# Patient Record
Sex: Male | Born: 1975 | Race: White | Hispanic: No | Marital: Married | Smoking: Former smoker
Health system: Southern US, Community
[De-identification: ages and names within clinical notes are randomized; demographics above are authoritative.]

## PROBLEM LIST (undated history)

## (undated) DIAGNOSIS — I10 Essential (primary) hypertension: Secondary | ICD-10-CM

## (undated) HISTORY — PX: TONSILLECTOMY: SUR1361

---

## 2014-01-24 ENCOUNTER — Emergency Department: Payer: Self-pay | Admitting: Emergency Medicine

## 2014-10-08 ENCOUNTER — Ambulatory Visit
Admission: EM | Admit: 2014-10-08 | Discharge: 2014-10-08 | Disposition: A | Discharge status: Home / Self Care | Payer: Self-pay | Attending: Internal Medicine | Admitting: Internal Medicine

## 2014-10-08 ENCOUNTER — Encounter: Payer: Self-pay | Admitting: Emergency Medicine

## 2014-10-08 DIAGNOSIS — R490 Dysphonia: Secondary | ICD-10-CM

## 2014-10-08 DIAGNOSIS — J392 Other diseases of pharynx: Secondary | ICD-10-CM

## 2014-10-08 DIAGNOSIS — R220 Localized swelling, mass and lump, head: Secondary | ICD-10-CM

## 2014-10-08 DIAGNOSIS — R070 Pain in throat: Secondary | ICD-10-CM

## 2014-10-08 DIAGNOSIS — R221 Localized swelling, mass and lump, neck: Secondary | ICD-10-CM

## 2014-10-08 MED ORDER — NAPROXEN 500 MG PO TABS: 500.0000 mg | ORAL_TABLET | Freq: Two times a day (BID) | ORAL | Status: DC

## 2014-10-08 NOTE — ED Provider Notes (Signed)
CSN: [id number]     Arrival date & time 10/08/14  1629 History   First MD Initiated Contact with Patient 10/08/14 1702     Chief Complaint  Patient presents with  . Sore Throat   (Consider location/radiation/quality/duration/timing/severity/associated sxs/prior Treatment) HPI  A 39 year old gentleman long history of smoking or late 1 pack per day since the age of 39 presents with discomfort in his throat particularly when he swallows he feels something rubbing in his throat on the right side. His wife is also noticed he has been very hoarse for the last week. The patient states that it is been worsening over the last 3-4 weeks although he's noticed this for 4 months. It is much worse in the morning; in order to be able to drink his coffee he must swallow ice water from an ice cube initially. He is unable to tell me if he has had weight loss. Is no shortness of breath or cough.  History reviewed. No pertinent past medical history. Past Surgical History  Procedure Laterality Date  . Tonsillectomy      adnoids removed also   History reviewed. No pertinent family history. History  Substance Use Topics  . Smoking status: Current Every Day Smoker -- 1.00 packs/day    Types: Cigarettes  . Smokeless tobacco: Not on file  . Alcohol Use: No    Review of Systems  HENT: Positive for sore throat.   All other systems reviewed and are negative.   Allergies  Penicillins  Home Medications   Prior to Admission medications   Medication Sig Start Date End Date Taking? Authorizing Provider  naproxen (NAPROSYN) 500 MG tablet Take 1 tablet (500 mg total) by mouth 2 (two) times daily with a meal. 10/08/14   Lutricia FeilWilliam P Madeline Bebout, PA-C   BP 125/76 mmHg  Pulse 78  Temp(Src) 98.1 F (36.7 C) (Oral)  Resp 16  SpO2 100% Physical Exam  Constitutional: He is oriented to person, place, and time. He appears well-developed and well-nourished.  HENT:  Head: Normocephalic and atraumatic.  Right Ear: External  ear normal.  Left Ear: External ear normal.  Mouth/Throat: Oropharynx is clear and moist.  Examination of pharynx shows no erythema. Exertion of the throat shows a hard mass adjacent to the level of the larynx and with pressure patient perceives radiation into his tongue and into his clavicle on the right. His voice is hoarse. He has no supraclavicular nodes palpable.  Neck:  See HEENT above  Pulmonary/Chest: Effort normal and breath sounds normal. No respiratory distress. He has no wheezes. He has no rales.  Neurological: He is alert and oriented to person, place, and time.  Skin: Skin is warm and dry.  Psychiatric: He has a normal mood and affect. His behavior is normal. Judgment and thought content normal.  Nursing note and vitals reviewed.   ED Course  Procedures (including critical care time) Labs Review Labs Reviewed - No data to display  Imaging Review No results found.   MDM   1. Hoarseness of voice   2. Throat pain in adult   3. Mass of throat    Discharge Medication List as of 10/08/2014  5:36 PM    START taking these medications   Details  naproxen (NAPROSYN) 500 MG tablet Take 1 tablet (500 mg total) by mouth 2 (two) times daily with a meal., Starting 10/08/2014, Until Discontinued, Print       Plan: 1. Diagnosis reviewed with patient 2. rx as per orders; risks, benefits,  potential side effects reviewed with patient 3. Recommend supportive treatment with  4. F/u prn if symptoms worsen or don't improve  I've had a discussion with the patient and his wife and told them I am concerned about the length of time that he has had the sore throat and now with his hoarseness and the right sided firm mass that I am palpating adjacent to the larynx. I told them that they will need a higher level of evaluation and care. Refer them to [facility] for further evaluation. In the meantime he asked for pain medication and I have placed him on some Naprosyn in the interim. I would  like to have him seen sooner than later and they will call tomorrow. I had difficulty with Epic and arranging the referral because [facility] does not populate. If any referral is requested I will certainly be able to do that over the phone. He will contact me if there is any difficulties.  Lutricia Feil, PA-C 10/08/14 1820

## 2014-10-08 NOTE — ED Notes (Signed)
Pt states that for the past 4 months he has had throat discomfort and it has gradually become worse

## 2014-10-08 NOTE — Discharge Instructions (Signed)
Hoarseness Hoarseness is produced from a variety of causes. It is important to find the cause so it can be treated. In the absence of a cold or upper respiratory illness, any hoarseness lasting more than 2 weeks should be looked at by a specialist. This is especially important if you have a history of smoking or alcohol use. It is also important to keep in mind that as you grow older, your voice will naturally get weaker, making it easier for you to become hoarse from straining your vocal cords.  CAUSES  Any illness that affects your vocal cords can result in a hoarse voice. Examples of conditions that can affect the vocal cords are listed as follows:   Allergies.  Colds.  Sinusitis.  Gastroesophageal reflux disease.  Croup.  Injury.  Nodules.  Exposure to smoke or toxic fumes or gases.  Congenital and genetic defects.  Paralysis of the vocal cords.  Infections.  Advanced age. DIAGNOSIS  In order to diagnose the cause of your hoarseness, your caregiver will examine your throat using an instrument that uses a tube with a small lighted camera (laryngoscope). It allows your caregiver to look into the mouth and down the throat. TREATMENT  For most cases, treatment will focus on the specific cause of the hoarseness. Depending on the cause, hoarseness can be a temporary condition (acute) or it can be long lasting (chronic). Most cases of hoarseness clear up without complications. Your caregiver will explain to you if this is not likely to happen. SEEK IMMEDIATE MEDICAL CARE IF:   You have increasing hoarseness or loss of voice.  You have shortness of breath.  You are coughing up blood.  There is pain in your neck or throat. Document Released: 03/04/2005 Document Revised: 06/13/2011 Document Reviewed: 05/27/2010 ExitCare Patient Information 2015 [employer]. This information is not intended to replace advice given to you by your health care provider. Make sure you discuss any  questions you have with your health care provider.  

## 2014-11-06 ENCOUNTER — Ambulatory Visit: Payer: Medicaid Other | Attending: Unknown Physician Specialty | Admitting: Speech Pathology

## 2014-11-13 ENCOUNTER — Ambulatory Visit: Payer: Medicaid Other | Admitting: Speech Pathology

## 2018-11-28 ENCOUNTER — Other Ambulatory Visit: Payer: Self-pay

## 2018-11-28 ENCOUNTER — Ambulatory Visit
Admission: EM | Admit: 2018-11-28 | Discharge: 2018-11-28 | Disposition: A | Discharge status: Home / Self Care | Payer: BC Managed Care – PPO | Attending: Family Medicine | Admitting: Family Medicine

## 2018-11-28 ENCOUNTER — Encounter: Payer: Self-pay | Admitting: Emergency Medicine

## 2018-11-28 DIAGNOSIS — B349 Viral infection, unspecified: Secondary | ICD-10-CM | POA: Diagnosis not present

## 2018-11-28 DIAGNOSIS — R0602 Shortness of breath: Secondary | ICD-10-CM

## 2018-11-28 DIAGNOSIS — J029 Acute pharyngitis, unspecified: Secondary | ICD-10-CM | POA: Diagnosis not present

## 2018-11-28 DIAGNOSIS — R05 Cough: Secondary | ICD-10-CM | POA: Diagnosis not present

## 2018-11-28 HISTORY — DX: Essential (primary) hypertension: I10

## 2018-11-28 LAB — RAPID STREP SCREEN (MED CTR MEBANE ONLY): Streptococcus, Group A Screen (Direct): NEGATIVE

## 2018-11-28 NOTE — ED Triage Notes (Signed)
Pt c/o cough, shortness of breath, sore throat, sinus congestion, body aches ans chills. Started last night.

## 2018-11-28 NOTE — ED Provider Notes (Addendum)
[facility]    CSN: [id number] Arrival date & time: 11/28/18  1101      History   Chief Complaint Chief Complaint  Patient presents with  . Cough  . Shortness of Breath    HPI David Berg is a 43 y.o. male.   43 yo male with a c/o cough, shortness of breath, sore throat, nasal congestion, chills and bodyaches since last night. Denies any chest pain or wheezing. States son was sick recently with similar symptoms and tested negative for covid.    Cough Associated symptoms: shortness of breath   Shortness of Breath Associated symptoms: cough     Past Medical History:  Diagnosis Date  . Hypertension     There are no active problems to display for this patient.   Past Surgical History:  Procedure Laterality Date  . TONSILLECTOMY     adnoids removed also       Home Medications    Prior to Admission medications   Medication Sig Start Date End Date Taking? Authorizing Provider  cyclobenzaprine (FLEXERIL) 10 MG tablet  08/29/18  Yes [provider]  gabapentin (NEURONTIN) 300 MG capsule  10/30/18  Yes [provider]  naproxen (NAPROSYN) 500 MG tablet Take 1 tablet (500 mg total) by mouth 2 (two) times daily with a meal. 10/08/14   Lutricia Feiloemer, William P, PA-C    Family History Family History  Problem Relation Age of Onset  . Cancer Father        throat    Social History Social History   Tobacco Use  . Smoking status: Former Smoker    Packs/day: 1.00    Types: Cigarettes  . Smokeless tobacco: Never Used  Substance Use Topics  . Alcohol use: No  . Drug use: No     Allergies   Penicillins   Review of Systems Review of Systems  Respiratory: Positive for cough and shortness of breath.      Physical Exam Triage Vital Signs ED Triage Vitals  Enc Vitals Group     BP 11/28/18 1136 110/90     Pulse Rate 11/28/18 1136 91     Resp 11/28/18 1136 18     Temp 11/28/18 1136 98.8 F (37.1 C)     Temp Source 11/28/18  1136 Oral     SpO2 11/28/18 1136 99 %     Weight 11/28/18 1128 177 lb (80.3 kg)     Height 11/28/18 1128 5\' 6"  (1.676 m)     Head Circumference --      Peak Flow --      Pain Score 11/28/18 1127 8     Pain Loc --      Pain Edu? --      Excl. in GC? --    No data found.  Updated Vital Signs BP 110/90 (BP Location: Left Arm)   Pulse 91   Temp 98.8 F (37.1 C) (Oral)   Resp 18   Ht 5\' 6"  (1.676 m)   Wt 80.3 kg   SpO2 99%   BMI 28.57 kg/m   Visual Acuity Right Eye Distance:   Left Eye Distance:   Bilateral Distance:    Right Eye Near:   Left Eye Near:    Bilateral Near:     Physical Exam Vitals signs and nursing note reviewed.  Constitutional:      General: He is not in acute distress.    Appearance: He is not toxic-appearing or diaphoretic.  Cardiovascular:  Rate and Rhythm: Normal rate and regular rhythm.  Pulmonary:     Effort: Pulmonary effort is normal. No respiratory distress.     Breath sounds: Normal breath sounds. No stridor. No wheezing, rhonchi or rales.  Neurological:     Mental Status: He is alert.      UC Treatments / Results  Labs (all labs ordered are listed, but only abnormal results are displayed) Labs Reviewed  RAPID STREP SCREEN ([facility] ONLY)  NOVEL CORONAVIRUS, NAA (HOSP ORDER, SEND-OUT TO REF LAB; TAT 18-24 HRS)  CULTURE, GROUP A STREP [facility])    EKG   Radiology No results found.  Procedures Procedures (including critical care time)  Medications Ordered in UC Medications - No data to display  Initial Impression / Assessment and Plan / UC Course  I have reviewed the triage vital signs and the nursing notes.  Pertinent labs & imaging results that were available during my care of the patient were reviewed by me and considered in my medical decision making (see chart for details).      Final Clinical Impressions(s) / UC Diagnoses   Final diagnoses:  Viral illness     Discharge Instructions     Rest,  fluids, tylenol Self quarantine and await test result    ED Prescriptions    None      1. Lab test result(negative strep) and diagnosis reviewed with patient 2. Recommend supportive treatment as above 3. covid testing done 4. Follow-up prn if symptoms worsen or don't improve   Controlled Substance Prescriptions [place] Controlled Substance Registry consulted? Not Applicable   Norval Gable, MD 11/28/18 [place], [place], MD 11/28/18 1248

## 2018-11-28 NOTE — Discharge Instructions (Signed)
Rest, fluids, tylenol Self quarantine and await test result

## 2018-11-29 LAB — NOVEL CORONAVIRUS, NAA (HOSP ORDER, SEND-OUT TO REF LAB; TAT 18-24 HRS): SARS-CoV-2, NAA: NOT DETECTED

## 2018-12-01 LAB — CULTURE, GROUP A STREP (THRC)

## 2019-02-19 ENCOUNTER — Ambulatory Visit (INDEPENDENT_AMBULATORY_CARE_PROVIDER_SITE_OTHER): Payer: BC Managed Care – PPO

## 2019-02-19 ENCOUNTER — Other Ambulatory Visit: Payer: Self-pay

## 2019-02-19 ENCOUNTER — Ambulatory Visit
Admission: EM | Admit: 2019-02-19 | Discharge: 2019-02-19 | Disposition: A | Discharge status: Home / Self Care | Payer: BC Managed Care – PPO | Attending: Family Medicine | Admitting: Family Medicine

## 2019-02-19 DIAGNOSIS — R0789 Other chest pain: Secondary | ICD-10-CM | POA: Diagnosis not present

## 2019-02-19 DIAGNOSIS — W228XXA Striking against or struck by other objects, initial encounter: Secondary | ICD-10-CM

## 2019-02-19 MED ORDER — KETOROLAC TROMETHAMINE 10 MG PO TABS: 10.0000 mg | ORAL_TABLET | Freq: Four times a day (QID) | ORAL | 0 refills | Status: AC | PRN

## 2019-02-19 MED ORDER — TRAMADOL HCL 50 MG PO TABS: 50.0000 mg | ORAL_TABLET | Freq: Three times a day (TID) | ORAL | 0 refills | Status: AC | PRN
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
## 2019-02-19 NOTE — Discharge Instructions (Signed)
No fracture seen.  Rest.  Medications as needed for pain.  Take care  Dr. Lacinda Axon
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
## 2019-04-26 ENCOUNTER — Other Ambulatory Visit: Payer: Self-pay

## 2019-04-26 ENCOUNTER — Other Ambulatory Visit: Payer: Self-pay | Admitting: Primary Care

## 2019-04-26 ENCOUNTER — Ambulatory Visit
Admission: RE | Admit: 2019-04-26 | Discharge: 2019-04-26 | Disposition: A | Discharge status: Home / Self Care | Payer: BLUE CROSS/BLUE SHIELD | Source: Ambulatory Visit | Attending: Primary Care | Admitting: Primary Care

## 2019-04-26 DIAGNOSIS — R413 Other amnesia: Secondary | ICD-10-CM | POA: Diagnosis present

## 2019-04-26 DIAGNOSIS — R2689 Other abnormalities of gait and mobility: Secondary | ICD-10-CM

## 2019-05-01 ENCOUNTER — Other Ambulatory Visit: Payer: Self-pay | Admitting: Primary Care

## 2019-05-01 DIAGNOSIS — R413 Other amnesia: Secondary | ICD-10-CM

## 2019-05-01 DIAGNOSIS — R2689 Other abnormalities of gait and mobility: Secondary | ICD-10-CM

## 2021-03-30 IMAGING — CT CT HEAD W/O CM
3 series · 15 of 47 positions shown, 18 images · non-contrast
Comparison: None.

CLINICAL DATA: Memory loss, imbalance, minor head trauma

EXAM:
CT HEAD WITHOUT CONTRAST
TECHNIQUE: Contiguous axial images were obtained from the base of the skull
through the vertex without intravenous contrast.

[Series 2: head wo · axial · 0.47mm/px · z∈[+995,+1120]mm · 9 of 31 slices shown, 12 images]
[im 3/31  brain]
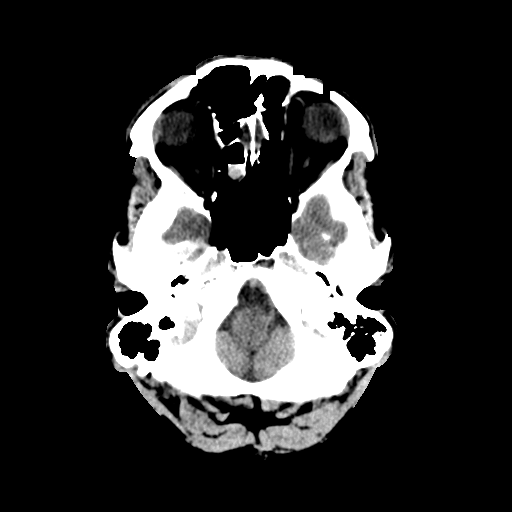
[im 3/31  bone]
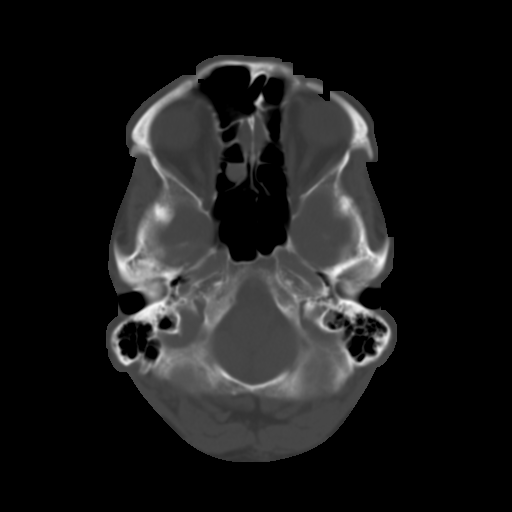
[im 6/31  brain]
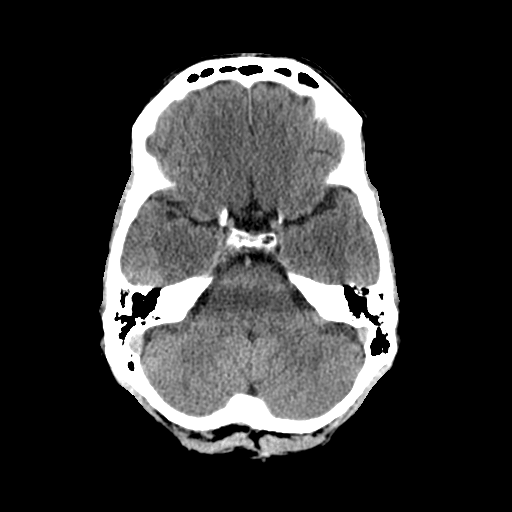
[im 9/31  brain]
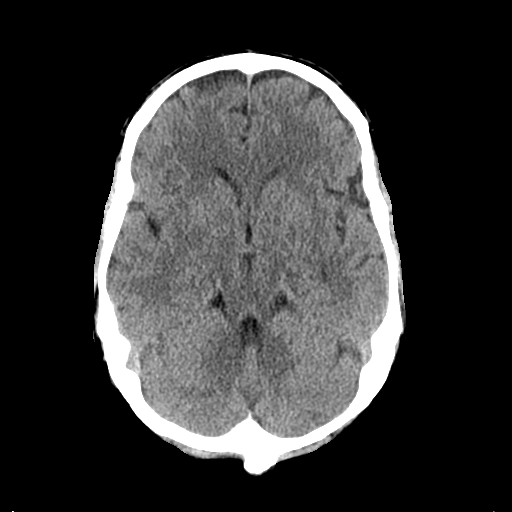
[im 12/31  brain]
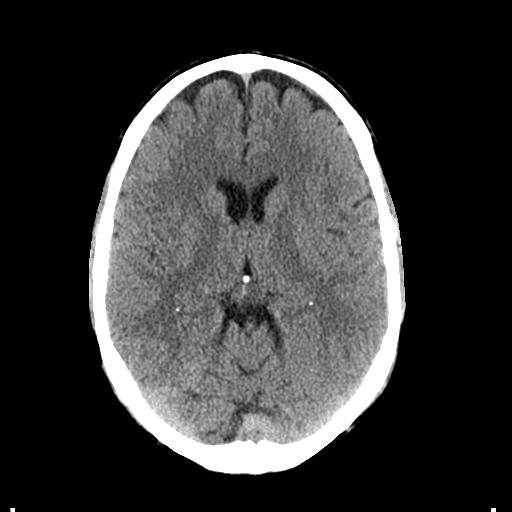
[im 16/31  brain]
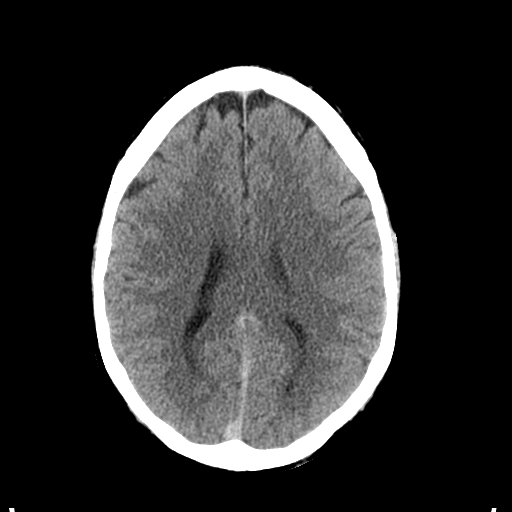
[im 16/31  bone]
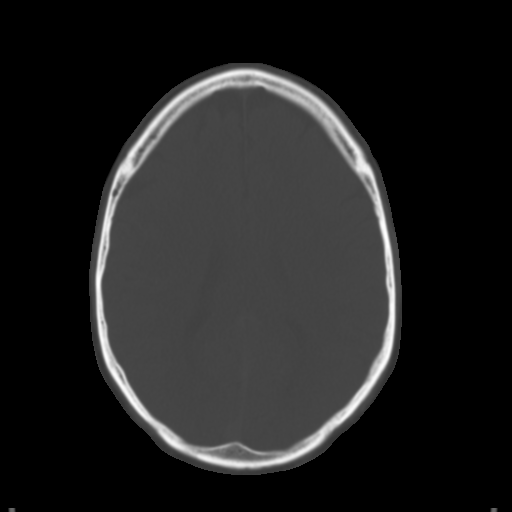
[im 19/31  brain]
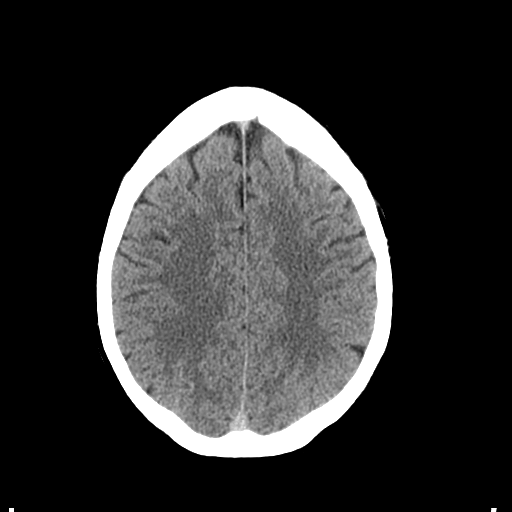
[im 22/31  brain]
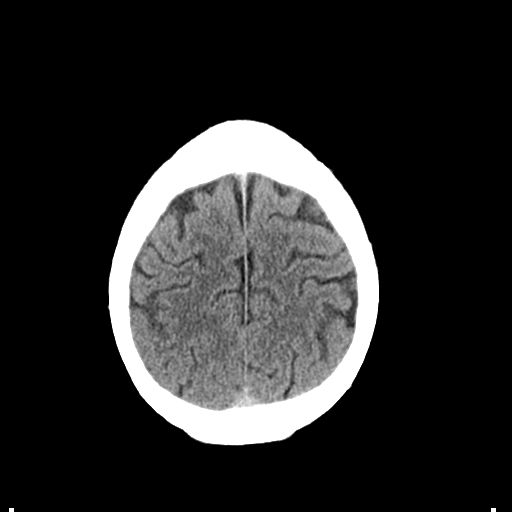
[im 25/31  brain]
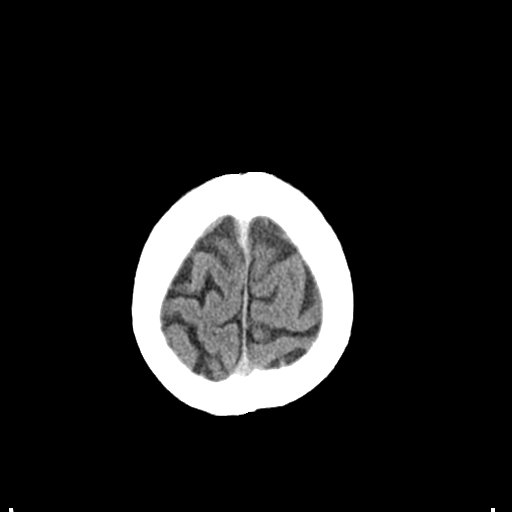
[im 28/31  brain]
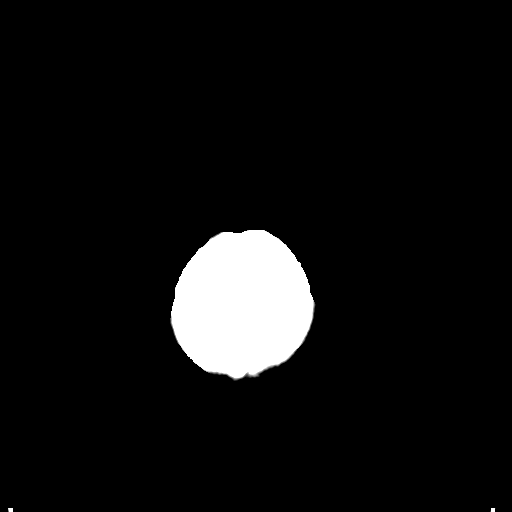
[im 28/31  bone]
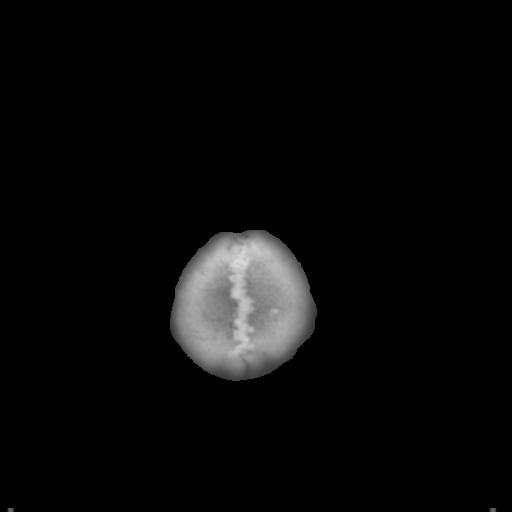

[Series 4: coronal soft tissue · coronal · 0.30mm/px · 3 of 72 slices shown]
[im 24/72  brain]
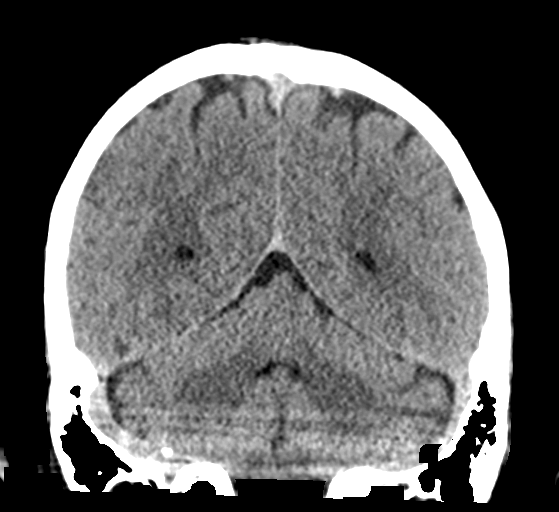
[im 32/72  brain]
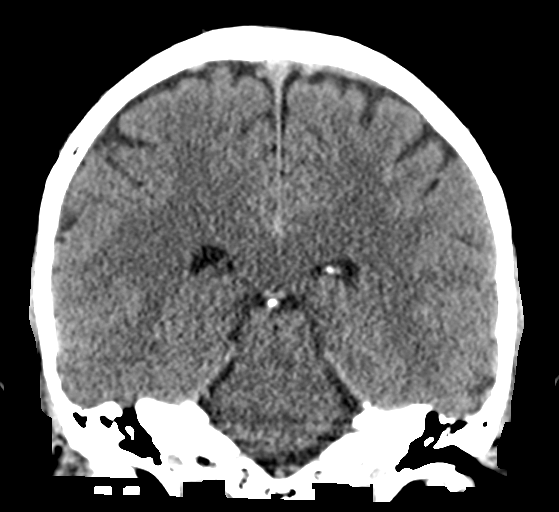
[im 40/72  brain]
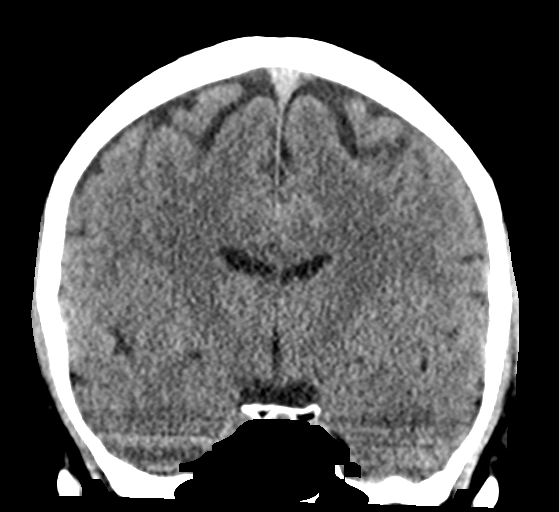

[Series 5: sagittal soft tissue · sagittal · 0.30mm/px · 3 of 53 slices shown]
[im 18/53  brain]
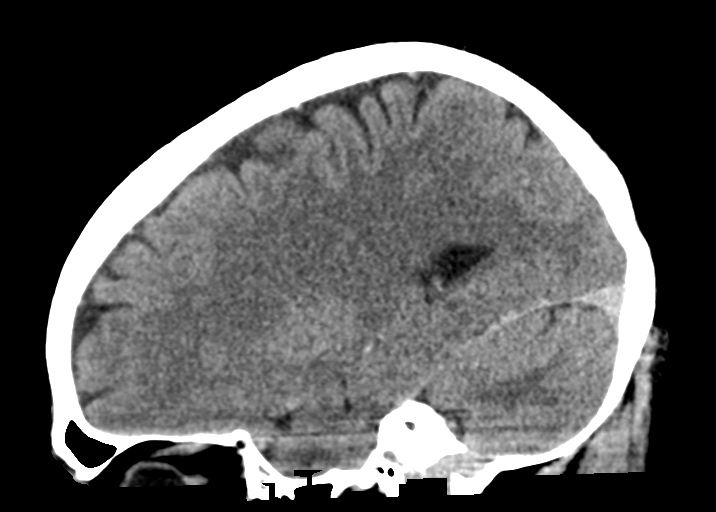
[im 27/53  brain]
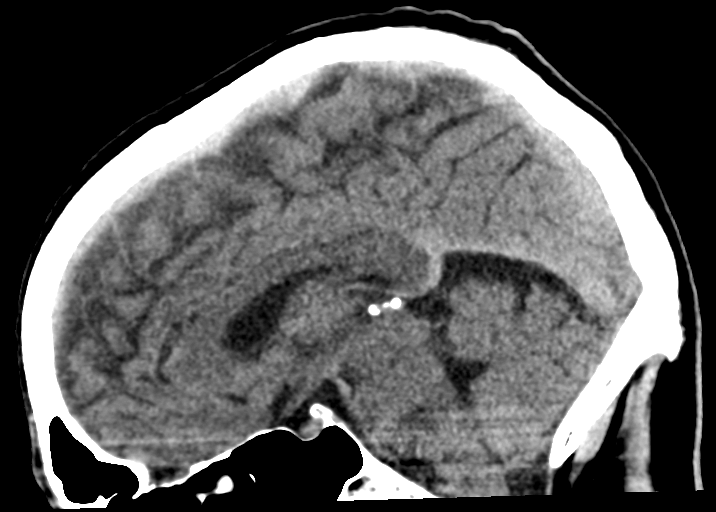
[im 35/53  brain]
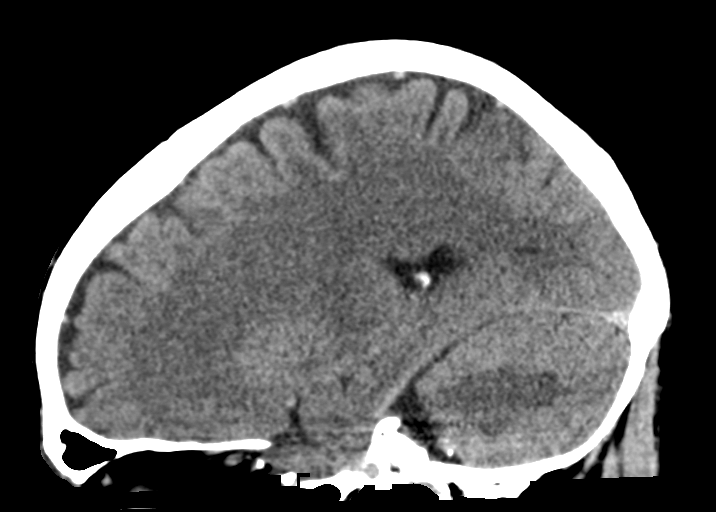

[15 of 47 positions shown; findings below may reference images not displayed]

FINDINGS: Brain: No evidence of acute infarction, hemorrhage, hydrocephalus,
extra-axial collection or mass lesion/mass effect.

Vascular: No hyperdense vessel or unexpected calcification.

Skull: Normal. Negative for fracture or focal lesion.

Sinuses/Orbits: No acute finding.

Other: None.
IMPRESSION: No acute intracranial pathology. No CT findings to explain memory
loss or imbalance.
